# Patient Record
Sex: Male | Born: 1990 | Race: White | Hispanic: No | Marital: Married | Smoking: Current some day smoker
Health system: Southern US, Community
[De-identification: ages and names within clinical notes are randomized; demographics above are authoritative.]

## PROBLEM LIST (undated history)

## (undated) DIAGNOSIS — I10 Essential (primary) hypertension: Secondary | ICD-10-CM

## (undated) DIAGNOSIS — F419 Anxiety disorder, unspecified: Secondary | ICD-10-CM

---
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
## 2010-10-28 ENCOUNTER — Encounter: Payer: Self-pay | Admitting: Family Medicine

## 2011-02-04 ENCOUNTER — Emergency Department (HOSPITAL_COMMUNITY): Payer: Self-pay

## 2011-02-04 ENCOUNTER — Emergency Department (HOSPITAL_COMMUNITY)
Admission: EM | Admit: 2011-02-04 | Discharge: 2011-02-04 | Disposition: A | Discharge status: Home / Self Care | Payer: Self-pay | Attending: Emergency Medicine | Admitting: Emergency Medicine

## 2011-02-04 DIAGNOSIS — Y9364 Activity, baseball: Secondary | ICD-10-CM | POA: Insufficient documentation

## 2011-02-04 DIAGNOSIS — IMO0002 Reserved for concepts with insufficient information to code with codable children: Secondary | ICD-10-CM | POA: Insufficient documentation

## 2011-02-04 DIAGNOSIS — Y9239 Other specified sports and athletic area as the place of occurrence of the external cause: Secondary | ICD-10-CM | POA: Insufficient documentation

## 2011-02-04 DIAGNOSIS — X500XXA Overexertion from strenuous movement or load, initial encounter: Secondary | ICD-10-CM | POA: Insufficient documentation

## 2011-02-06 ENCOUNTER — Other Ambulatory Visit (HOSPITAL_COMMUNITY): Payer: Self-pay | Admitting: Sports Medicine

## 2011-02-06 DIAGNOSIS — M25561 Pain in right knee: Secondary | ICD-10-CM

## 2011-02-06 DIAGNOSIS — S83519A Sprain of anterior cruciate ligament of unspecified knee, initial encounter: Secondary | ICD-10-CM

## 2011-02-11 ENCOUNTER — Ambulatory Visit (HOSPITAL_COMMUNITY)
Admission: RE | Admit: 2011-02-11 | Discharge: 2011-02-11 | Disposition: A | Discharge status: Home / Self Care | Payer: Self-pay | Source: Ambulatory Visit | Attending: Sports Medicine | Admitting: Sports Medicine

## 2011-02-11 DIAGNOSIS — W219XXA Striking against or struck by unspecified sports equipment, initial encounter: Secondary | ICD-10-CM | POA: Insufficient documentation

## 2011-02-11 DIAGNOSIS — M25469 Effusion, unspecified knee: Secondary | ICD-10-CM | POA: Insufficient documentation

## 2011-02-11 DIAGNOSIS — M25569 Pain in unspecified knee: Secondary | ICD-10-CM | POA: Insufficient documentation

## 2011-02-11 DIAGNOSIS — M25561 Pain in right knee: Secondary | ICD-10-CM

## 2011-02-11 DIAGNOSIS — S83519A Sprain of anterior cruciate ligament of unspecified knee, initial encounter: Secondary | ICD-10-CM

## 2011-02-11 DIAGNOSIS — Y9366 Activity, soccer: Secondary | ICD-10-CM | POA: Insufficient documentation

## 2011-02-11 DIAGNOSIS — S8000XA Contusion of unspecified knee, initial encounter: Secondary | ICD-10-CM | POA: Insufficient documentation

## 2011-02-13 ENCOUNTER — Other Ambulatory Visit (HOSPITAL_COMMUNITY): Payer: Self-pay

## 2012-07-23 IMAGING — CR DG KNEE COMPLETE 4+V*R*
4 series · 4 of 4 positions shown · non-contrast
Comparison: None.

CLINICAL DATA: Injured playing softball, pain

RIGHT KNEE - COMPLETE 4+ VIEW

[view not recorded (1 of 4)]
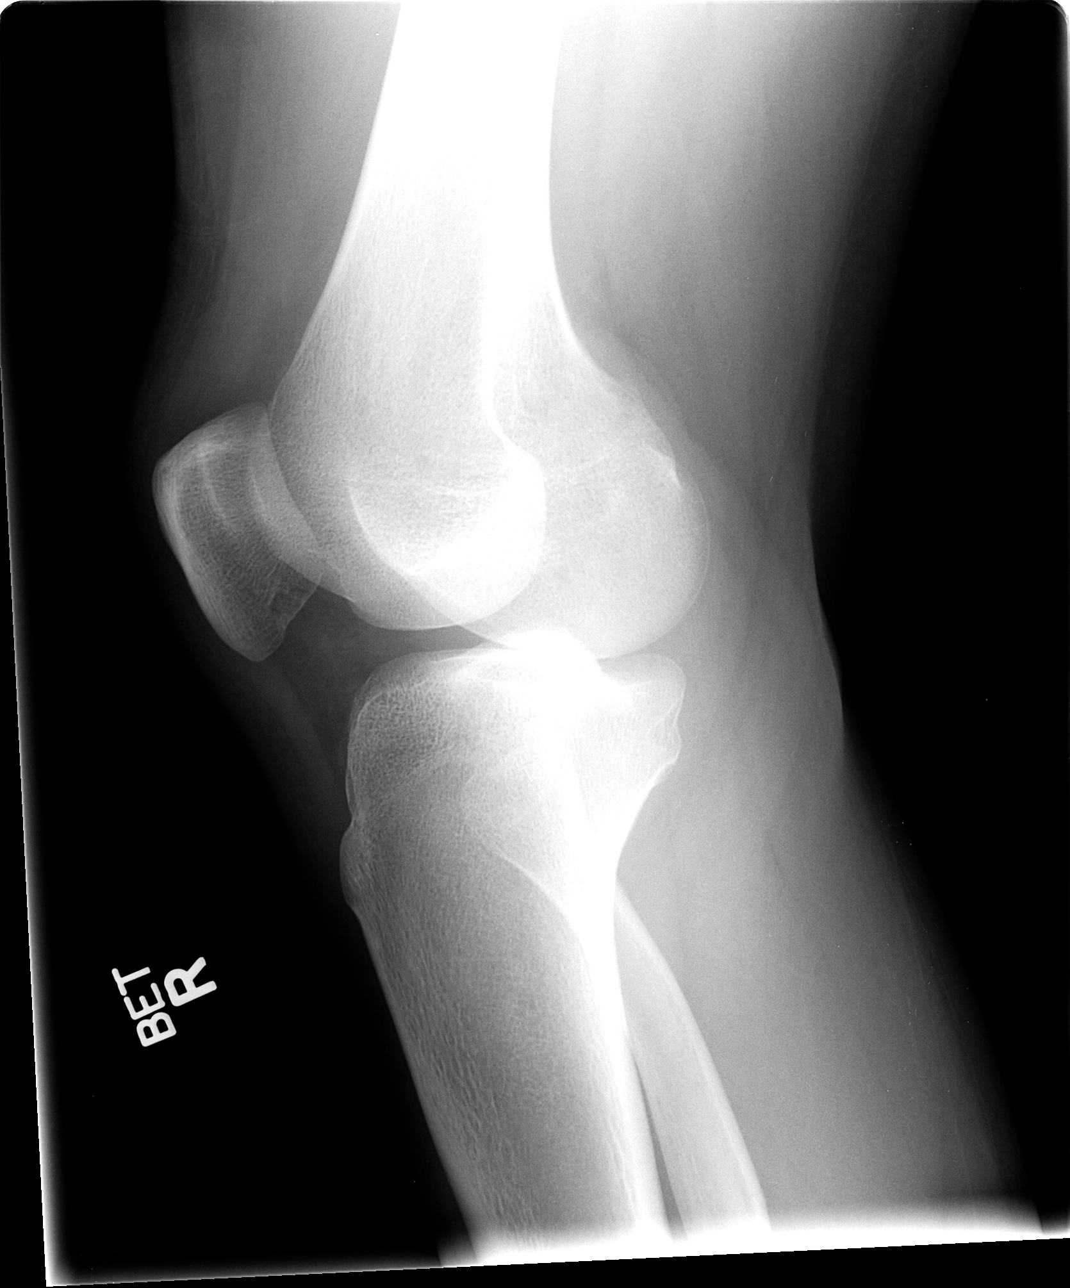

[view not recorded (2 of 4)]
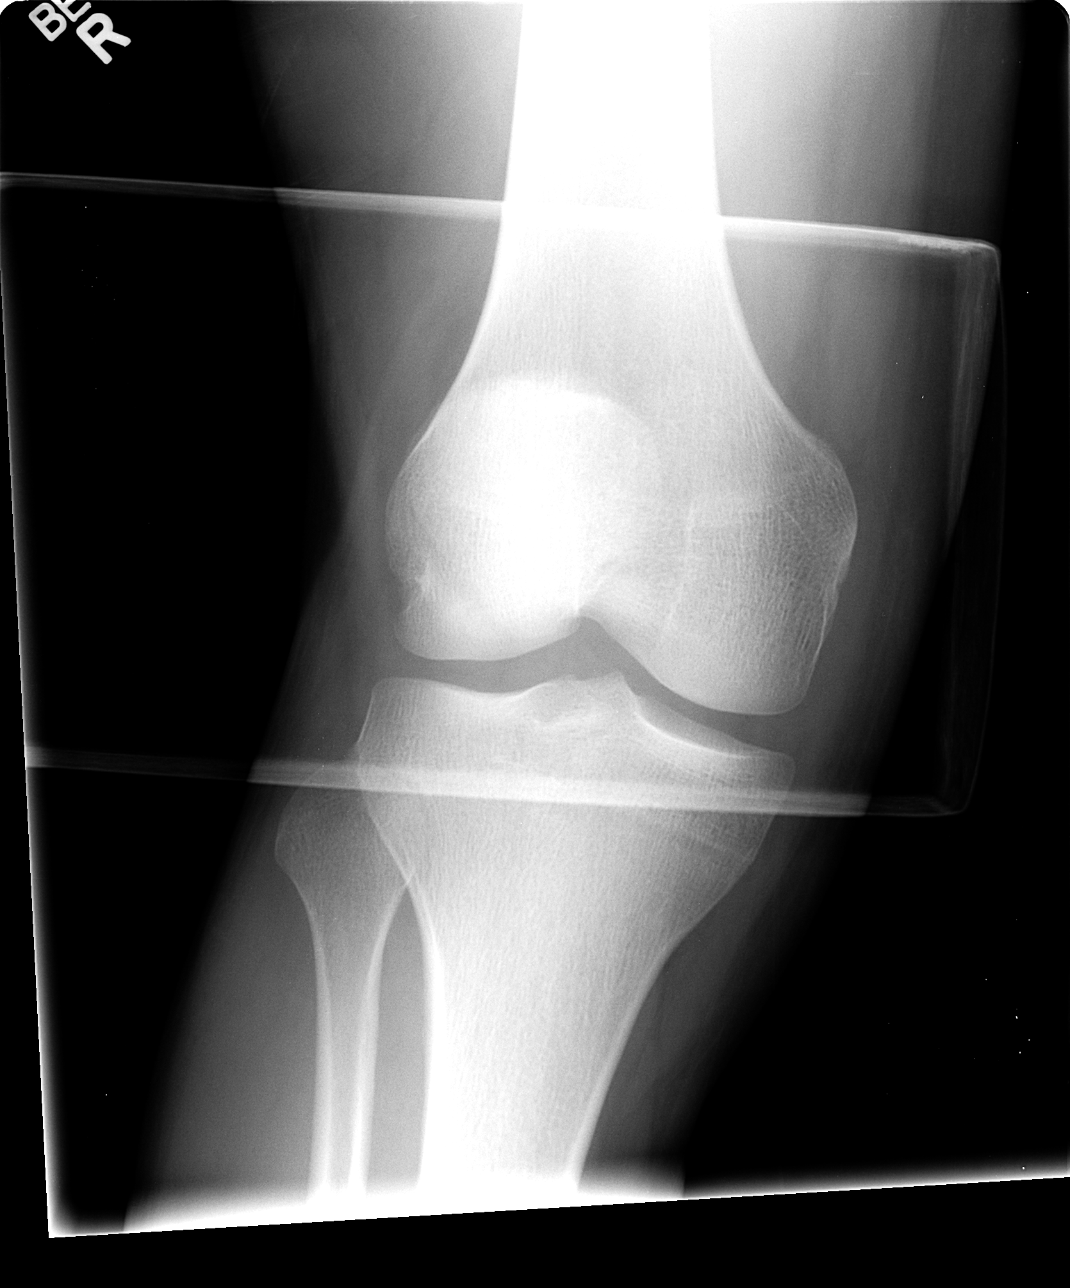

[view not recorded (3 of 4)]
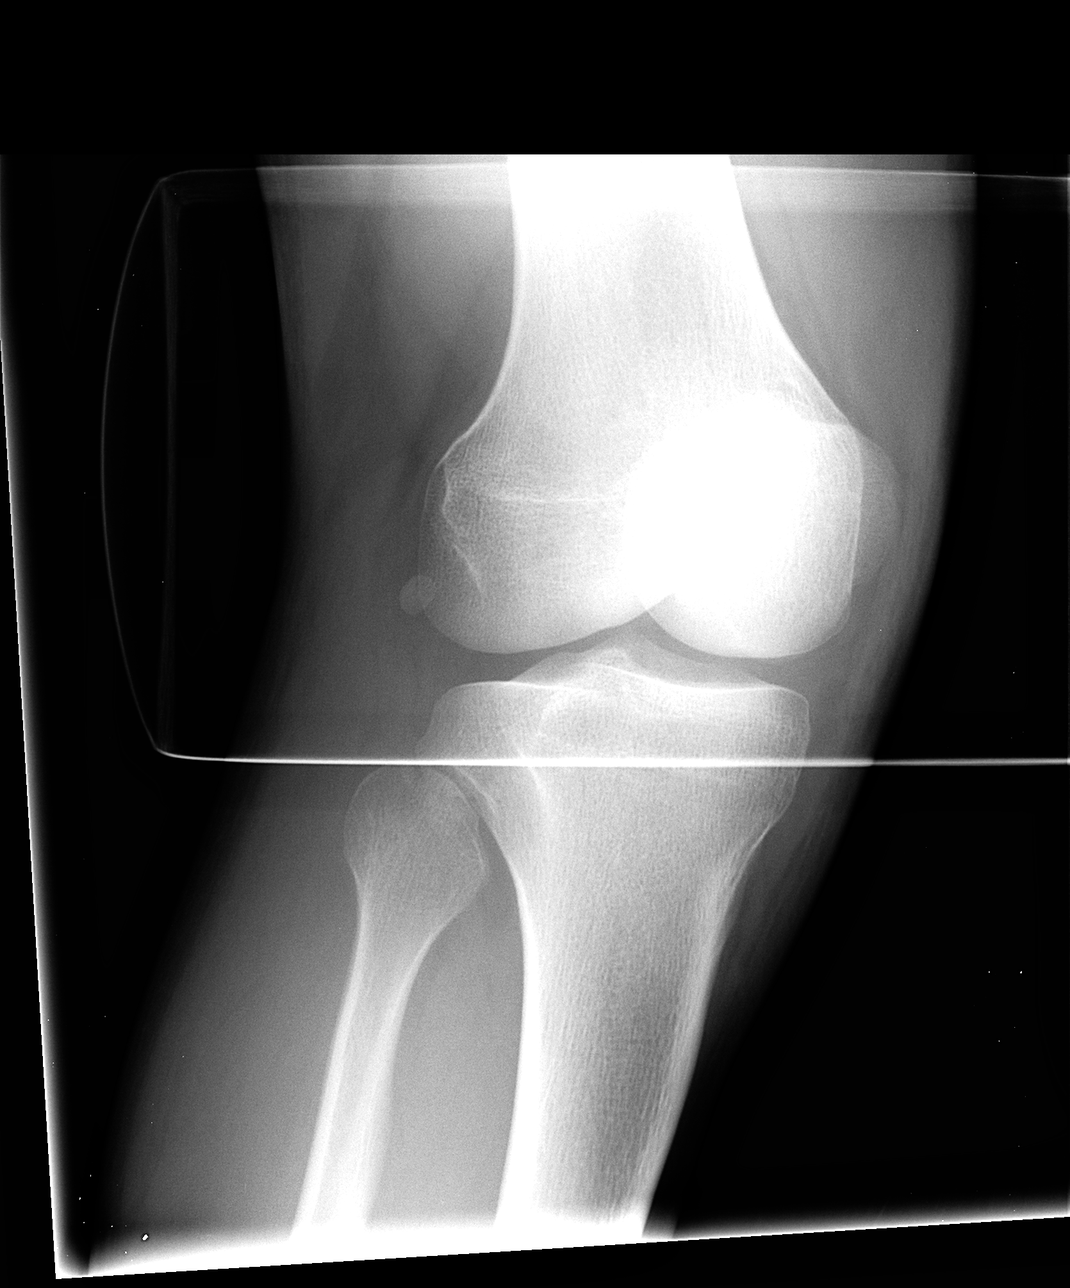

[view not recorded (4 of 4)]
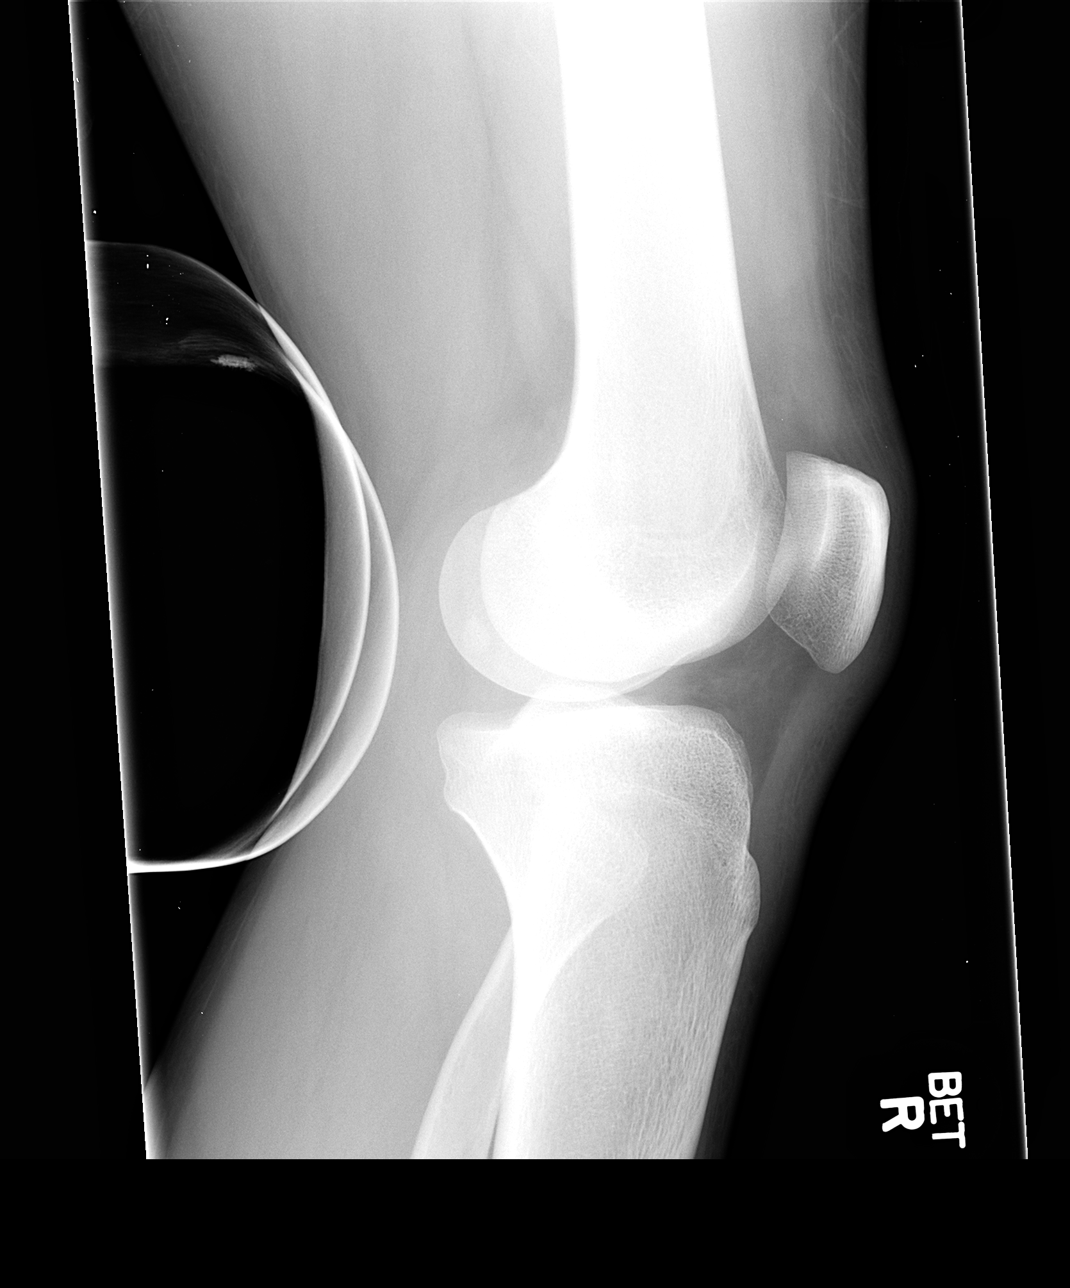

[4 of 4 positions shown; findings below may reference images not displayed]

FINDINGS: There is no evidence of fracture, dislocation, or joint
effusion.  There is no evidence of arthropathy or other focal bone
abnormality.  Soft tissues are unremarkable.
IMPRESSION: Negative.

## 2012-08-20 ENCOUNTER — Other Ambulatory Visit (HOSPITAL_COMMUNITY): Payer: Self-pay | Admitting: Physical Medicine and Rehabilitation

## 2012-08-20 DIAGNOSIS — M25569 Pain in unspecified knee: Secondary | ICD-10-CM

## 2012-08-25 ENCOUNTER — Ambulatory Visit (HOSPITAL_COMMUNITY): Admission: RE | Admit: 2012-08-25 | Payer: BC Managed Care – PPO | Source: Ambulatory Visit

## 2015-02-21 ENCOUNTER — Emergency Department (HOSPITAL_COMMUNITY): Payer: 59

## 2015-02-21 ENCOUNTER — Emergency Department (HOSPITAL_COMMUNITY)
Admission: EM | Admit: 2015-02-21 | Discharge: 2015-02-21 | Disposition: A | Discharge status: Home Health | Payer: 59 | Attending: Emergency Medicine | Admitting: Emergency Medicine

## 2015-02-21 ENCOUNTER — Encounter (HOSPITAL_COMMUNITY): Payer: Self-pay | Admitting: Emergency Medicine

## 2015-02-21 DIAGNOSIS — F419 Anxiety disorder, unspecified: Secondary | ICD-10-CM | POA: Insufficient documentation

## 2015-02-21 DIAGNOSIS — I10 Essential (primary) hypertension: Secondary | ICD-10-CM | POA: Insufficient documentation

## 2015-02-21 DIAGNOSIS — Z79899 Other long term (current) drug therapy: Secondary | ICD-10-CM | POA: Insufficient documentation

## 2015-02-21 DIAGNOSIS — R079 Chest pain, unspecified: Secondary | ICD-10-CM | POA: Diagnosis not present

## 2015-02-21 DIAGNOSIS — Z720 Tobacco use: Secondary | ICD-10-CM | POA: Insufficient documentation

## 2015-02-21 HISTORY — DX: Anxiety disorder, unspecified: F41.9

## 2015-02-21 HISTORY — DX: Essential (primary) hypertension: I10

## 2015-02-21 LAB — CBC WITH DIFFERENTIAL/PLATELET
BASOS ABS: 0.1 10*3/uL (ref 0.0–0.1)
BASOS PCT: 1 % (ref 0–1)
EOS ABS: 0.4 10*3/uL (ref 0.0–0.7)
EOS PCT: 4 % (ref 0–5)
HEMATOCRIT: 40.8 % (ref 39.0–52.0)
Hemoglobin: 14 g/dL (ref 13.0–17.0)
Lymphocytes Relative: 17 % (ref 12–46)
Lymphs Abs: 1.8 10*3/uL (ref 0.7–4.0)
MCH: 29.9 pg (ref 26.0–34.0)
MCHC: 34.3 g/dL (ref 30.0–36.0)
MCV: 87 fL (ref 78.0–100.0)
Monocytes Absolute: 0.6 10*3/uL (ref 0.1–1.0)
Monocytes Relative: 6 % (ref 3–12)
Neutro Abs: 7.9 10*3/uL — ABNORMAL HIGH (ref 1.7–7.7)
Neutrophils Relative %: 72 % (ref 43–77)
PLATELETS: 197 10*3/uL (ref 150–400)
RBC: 4.69 MIL/uL (ref 4.22–5.81)
RDW: 11.9 % (ref 11.5–15.5)
WBC: 10.8 10*3/uL — AB (ref 4.0–10.5)

## 2015-02-21 LAB — BASIC METABOLIC PANEL
Anion gap: 5 (ref 5–15)
BUN: 14 mg/dL (ref 6–20)
CALCIUM: 9.4 mg/dL (ref 8.9–10.3)
CHLORIDE: 104 mmol/L (ref 101–111)
CO2: 29 mmol/L (ref 22–32)
CREATININE: 0.76 mg/dL (ref 0.61–1.24)
GFR calc Af Amer: >60 mL/min (ref >60)
Glucose, Bld: 105 mg/dL — ABNORMAL HIGH (ref 65–99)
Potassium: 4.7 mmol/L (ref 3.5–5.1)
Sodium: 138 mmol/L (ref 135–145)

## 2015-02-21 LAB — TROPONIN I

## 2015-02-21 MED ORDER — FAMOTIDINE 20 MG PO TABS
20.0000 mg | ORAL_TABLET | Freq: Once | ORAL | Status: AC
  Administered 2015-02-21: 20 mg via ORAL
  Filled 2015-02-21: qty 1

## 2015-02-21 MED ORDER — OMEPRAZOLE 20 MG PO CPDR: 20.0000 mg | DELAYED_RELEASE_CAPSULE | Freq: Every day | ORAL | Status: AC

## 2015-02-21 NOTE — ED Provider Notes (Signed)
CSN: [id number]     Arrival date & time 02/21/15  1018 History   First MD Initiated Contact with Patient 02/21/15 1026     Chief Complaint  Patient presents with  . Chest Pain     (Consider location/radiation/quality/duration/timing/severity/associated sxs/prior Treatment) HPI Comments: 24 year old male with smoking and high blood pressure history, family history of cardiac and his father small heart attack in his 8240s per patient report presents with sharp anterior chest pain nonradiating as well as nausea and tingling in bilateral hands constant for the past hour started while driving. No exertional symptoms recently. No blood clot risk factors. Patient has mild symptoms currently. Nothing specifically worsens or improves, patient felt similar to reflux history.  Patient is a 24 y.o. male presenting with chest pain. The history is provided by the patient.  Chest Pain Associated symptoms: numbness   Associated symptoms: no abdominal pain, no back pain, no fever, no headache, no shortness of breath and not vomiting     Past Medical History  Diagnosis Date  . Hypertension   . Anxiety    History reviewed. No pertinent past surgical history. History reviewed. No pertinent family history. History  Substance Use Topics  . Smoking status: Current Some Day Smoker  . Smokeless tobacco: Not on file  . Alcohol Use: Yes     Comment: occassionally    Review of Systems  Constitutional: Negative for fever and chills.  HENT: Negative for congestion.   Eyes: Negative for visual disturbance.  Respiratory: Negative for shortness of breath.   Cardiovascular: Positive for chest pain.  Gastrointestinal: Negative for vomiting and abdominal pain.  Genitourinary: Negative for dysuria and flank pain.  Musculoskeletal: Negative for back pain, neck pain and neck stiffness.  Skin: Negative for rash.  Neurological: Positive for numbness. Negative for light-headedness and headaches.      Allergies   Review of patient's allergies indicates no known allergies.  Home Medications   Prior to Admission medications   Medication Sig Start Date End Date Taking? Authorizing Provider  ALPRAZolam Prudy Feeler(XANAX) 1 MG tablet Take 1 tablet by mouth 3 (three) times daily as needed. 02/16/15  Yes Historical Provider, MD  HYDROcodone-acetaminophen (NORCO) 7.5-325 MG per tablet Take 0.5-1 tablets by mouth 3 (three) times daily as needed for moderate pain.  02/09/15  Yes Historical Provider, MD  ibuprofen (ADVIL,MOTRIN) 200 MG tablet Take 400-600 mg by mouth every 6 (six) hours as needed for moderate pain.   Yes Historical Provider, MD  traMADol (ULTRAM) 50 MG tablet Take 1 tablet by mouth daily as needed. 02/08/15  Yes Historical Provider, MD   BP 117/65 mmHg  Pulse 50  Temp(Src) 97.3 F (36.3 C) (Oral)  Resp 20  Ht 6\' 1"  (1.854 m)  Wt 210 lb (95.255 kg)  BMI 27.71 kg/m2  SpO2 99% Physical Exam  Constitutional: He is oriented to person, place, and time. He appears well-developed and well-nourished.  HENT:  Head: Normocephalic and atraumatic.  Eyes: Conjunctivae are normal. Right eye exhibits no discharge. Left eye exhibits no discharge.  Neck: Normal range of motion. Neck supple. No tracheal deviation present.  Cardiovascular: Regular rhythm and intact distal pulses.   No murmur heard. Pulmonary/Chest: Effort normal and breath sounds normal.  Abdominal: Soft. He exhibits no distension. There is no tenderness. There is no guarding.  Musculoskeletal: He exhibits no edema.  Neurological: He is alert and oriented to person, place, and time.  Skin: Skin is warm. No rash noted.  Psychiatric: He has a normal  mood and affect.  Nursing note and vitals reviewed.   ED Course  Procedures (including critical care time) Labs Review Labs Reviewed  BASIC METABOLIC PANEL - Abnormal; Notable for the following:    Glucose, Bld 105 (*)    All other components within normal limits  CBC WITH DIFFERENTIAL/PLATELET -  Abnormal; Notable for the following:    WBC 10.8 (*)    Neutro Abs 7.9 (*)    All other components within normal limits  TROPONIN I  TROPONIN I    Imaging Review Dg Chest 2 View  02/21/2015   CLINICAL DATA:  Acute chest pain for 1 day.  Former smoker.  EXAM: CHEST  2 VIEW  COMPARISON:  None.  FINDINGS: The heart size and mediastinal contours are within normal limits. Both lungs are clear. The visualized skeletal structures are unremarkable.  IMPRESSION: No active cardiopulmonary disease.   Electronically Signed   By: Judie PetitM.  Shick M.D.   On: 02/21/2015 11:02     EKG Interpretation   Date/Time:  Tuesday Feb 21 2015 10:29:14 EDT Ventricular Rate:  50 PR Interval:  133 QRS Duration: 88 QT Interval:  419 QTC Calculation: 382 R Axis:   72 Text Interpretation:  Sinus rhythm Confirmed by Kadra Kohan  MD, Carmel Garfield ([id number])  on 02/21/2015 11:43:21 AM      MDM   Final diagnoses:  Chest pain, unspecified chest pain type   Well-appearing male, low risk cardiac, very low risk blood clot. PE RC negative. EKG reviewed no acute findings. Plan for cardiac screen the ER, Pepcid and close outpatient follow-up.  Patient improved in ER, delta troponin and outpatient follow-up. Results and differential diagnosis were discussed with the patient/parent/guardian. Close follow up outpatient was discussed, comfortable with the plan.   Medications  famotidine (PEPCID) tablet 20 mg (20 mg Oral Given 02/21/15 1042)    Filed Vitals:   02/21/15 1026 02/21/15 1030 02/21/15 1040 02/21/15 1230  BP: 134/77 134/77  117/65  Pulse: 50 73 52 50  Temp:  97.8 F (36.6 C)  97.3 F (36.3 C)  TempSrc:  Oral  Oral  Resp:  18 15 20   Height:  6\' 1"  (1.854 m)    Weight:  210 lb (95.255 kg)    SpO2: 100% 100% 100% 99%    Final diagnoses:  Chest pain, unspecified chest pain type        Blane OharaJoshua Electa Sterry, MD 02/21/15 1319

## 2015-02-21 NOTE — ED Notes (Signed)
MD at bedside. 

## 2015-02-21 NOTE — Discharge Instructions (Signed)
If you were given medicines take as directed.  If you are on coumadin or contraceptives realize their levels and effectiveness is altered by many different medicines.  If you have any reaction (rash, tongues swelling, other) to the medicines stop taking and see a physician.   Please follow up as directed and return to the ER or see a physician for new or worsening symptoms.  Thank you. Filed Vitals:   02/21/15 1026 02/21/15 1030 02/21/15 1040  BP: 134/77 134/77   Pulse: 50 73 52  Temp:  97.8 F (36.6 C)   TempSrc:  Oral   Resp:  18 15  Height:  6\' 1"  (1.854 m)   Weight:  210 lb (95.255 kg)   SpO2: 100% 100% 100%    Chest Pain (Nonspecific) It is often hard to give a diagnosis for the cause of chest pain. There is always a chance that your pain could be related to something serious, such as a heart attack or a blood clot in the lungs. You need to follow up with your doctor. HOME CARE  If antibiotic medicine was given, take it as directed by your doctor. Finish the medicine even if you start to feel better.  For the next few days, avoid activities that bring on chest pain. Continue physical activities as told by your doctor.  Do not use any tobacco products. This includes cigarettes, chewing tobacco, and e-cigarettes.  Avoid drinking alcohol.  Only take medicine as told by your doctor.  Follow your doctor's suggestions for more testing if your chest pain does not go away.  Keep all doctor visits you made. GET HELP IF:  Your chest pain does not go away, even after treatment.  You have a rash with blisters on your chest.  You have a fever. GET HELP RIGHT AWAY IF:   You have more pain or pain that spreads to your arm, neck, jaw, back, or belly (abdomen).  You have shortness of breath.  You cough more than usual or cough up blood.  You have very bad back or belly pain.  You feel sick to your stomach (nauseous) or throw up (vomit).  You have very bad weakness.  You pass  out (faint).  You have chills. This is an emergency. Do not wait to see if the problems will go away. Call your local emergency services (911 in [place] Do not drive yourself to the hospital. MAKE SURE YOU:   Understand these instructions.  Will watch your condition.  Will get help right away if you are not doing well or get worse. Document Released: 03/11/2008 Document Revised: 09/28/2013 Document Reviewed: 03/11/2008 [facility] Patient Information 2015 [place], [place]. This information is not intended to replace advice given to you by your health care provider. Make sure you discuss any questions you have with your health care provider.

## 2017-04-28 ENCOUNTER — Other Ambulatory Visit (HOSPITAL_COMMUNITY): Payer: Self-pay | Admitting: Family Medicine

## 2017-04-28 ENCOUNTER — Ambulatory Visit (HOSPITAL_COMMUNITY)
Admission: RE | Admit: 2017-04-28 | Discharge: 2017-04-28 | Disposition: A | Discharge status: Home / Self Care | Payer: Self-pay | Source: Ambulatory Visit | Attending: Family Medicine | Admitting: Family Medicine

## 2017-04-28 DIAGNOSIS — M25561 Pain in right knee: Secondary | ICD-10-CM
